# Patient Record
Sex: Male | Born: 1970 | Hispanic: Yes
Health system: Southern US, Community
[De-identification: ages and names within clinical notes are randomized; demographics above are authoritative.]

---

## 2019-03-28 ENCOUNTER — Other Ambulatory Visit: Payer: Self-pay

## 2019-03-28 ENCOUNTER — Encounter: Payer: Self-pay | Admitting: Emergency Medicine

## 2019-03-28 ENCOUNTER — Emergency Department
Admission: EM | Admit: 2019-03-28 | Discharge: 2019-03-28 | Disposition: A | Discharge status: Home / Self Care | Payer: Self-pay | Attending: Emergency Medicine | Admitting: Emergency Medicine

## 2019-03-28 ENCOUNTER — Emergency Department: Payer: Self-pay

## 2019-03-28 DIAGNOSIS — R0789 Other chest pain: Secondary | ICD-10-CM | POA: Insufficient documentation

## 2019-03-28 LAB — CBC
HCT: 41.5 % (ref 39.0–52.0)
Hemoglobin: 14.3 g/dL (ref 13.0–17.0)
MCH: 31.6 pg (ref 26.0–34.0)
MCHC: 34.5 g/dL (ref 30.0–36.0)
MCV: 91.6 fL (ref 80.0–100.0)
Platelets: 198 10*3/uL (ref 150–400)
RBC: 4.53 MIL/uL (ref 4.22–5.81)
RDW: 12.3 % (ref 11.5–15.5)
WBC: 7.3 10*3/uL (ref 4.0–10.5)
nRBC: 0 % (ref 0.0–0.2)

## 2019-03-28 LAB — BASIC METABOLIC PANEL
Anion gap: 7 (ref 5–15)
BUN: 17 mg/dL (ref 6–20)
CO2: 25 mmol/L (ref 22–32)
Calcium: 8.9 mg/dL (ref 8.9–10.3)
Chloride: 108 mmol/L (ref 98–111)
Creatinine, Ser: 0.9 mg/dL (ref 0.61–1.24)
GFR calc Af Amer: >60 mL/min (ref >60)
GFR calc non Af Amer: >60 mL/min (ref >60)
Glucose, Bld: 167 mg/dL — ABNORMAL HIGH (ref 70–99)
Potassium: 3.4 mmol/L — ABNORMAL LOW (ref 3.5–5.1)
Sodium: 140 mmol/L (ref 135–145)

## 2019-03-28 LAB — TROPONIN I (HIGH SENSITIVITY): Troponin I (High Sensitivity): <2 ng/L (ref <18)

## 2019-03-28 MED ORDER — SODIUM CHLORIDE 0.9% FLUSH
3.0000 mL | Freq: Once | INTRAVENOUS | Status: AC
  Administered 2019-03-28: 3 mL via INTRAVENOUS

## 2019-03-28 MED ORDER — NAPROXEN 500 MG PO TABS: 500.0000 mg | ORAL_TABLET | Freq: Two times a day (BID) | ORAL | 2 refills | Status: AC

## 2019-03-28 MED ORDER — KETOROLAC TROMETHAMINE 30 MG/ML IJ SOLN
30.0000 mg | Freq: Once | INTRAMUSCULAR | Status: AC
  Administered 2019-03-28: 30 mg via INTRAMUSCULAR
  Filled 2019-03-28: qty 1

## 2019-03-28 NOTE — ED Provider Notes (Signed)
[facility] Emergency Department Provider Note   ____________________________________________    I have reviewed the triage vital signs and the nursing notes.   HISTORY  Chief Complaint Chest Pain     HPI Ian Rodriguez is a 48 y.o. male who presents with complaints of chest pain.  Patient describes left-sided chest discomforts essentially in his left axilla which is worse with particular movements that is been ongoing over the last 2 weeks.  He denies fevers or chills.  No cough or shortness of breath.  No nausea vomiting or diaphoresis.  He has never had this before.  He denies calf pain or swelling.  No pleurisy.  Has not take anything for this.  Does not smoke.  History reviewed. No pertinent past medical history.  There are no active problems to display for this patient.   History reviewed. No pertinent surgical history.  Prior to Admission medications   Medication Sig Start Date End Date Taking? Authorizing Provider  naproxen (NAPROSYN) 500 MG tablet Take 1 tablet (500 mg total) by mouth 2 (two) times daily with a meal. 03/28/19   Lavonia Drafts, MD     Allergies Patient has no allergy information on record.  No family history on file.  Social History No smoking, no alcohol Review of Systems  Constitutional: No fever/chills Eyes: No visual changes.  ENT: No sore throat. Cardiovascular: As above Respiratory: Denies shortness of breath. Gastrointestinal: No abdominal pain.  No nausea, no vomiting.   Genitourinary: Negative for dysuria. Musculoskeletal: Negative for back pain. Skin: Negative for rash. Neurological: Negative for headaches or weakness   ____________________________________________   PHYSICAL EXAM:  VITAL SIGNS: ED Triage Vitals  Enc Vitals Group     BP 03/28/19 2011 131/71     Pulse Rate 03/28/19 2011 64     Resp 03/28/19 2011 19     Temp 03/28/19 2011 98.6 F (37 C)     Temp Source 03/28/19 2011 Oral   SpO2 03/28/19 2011 98 %     Weight 03/28/19 2013 81.6 kg (180 lb)     Height 03/28/19 2013 1.702 m (5\' 7" )     Head Circumference --      Peak Flow --      Pain Score 03/28/19 2013 6     Pain Loc --      Pain Edu? --      Excl. in [place]? --     Constitutional: Alert and oriented. No acute distress. Pleasant and interactive  Nose: No congestion/rhinnorhea.  Cardiovascular: Normal rate, regular rhythm. Grossly normal heart sounds.  Good peripheral circulation.  Point tenderness to palpation and essentially the left mid axillary line at approximately the fourth rib which causes pain, no rash swelling or abnormality noted Respiratory: Normal respiratory effort.  No retractions. Lungs CTAB. Gastrointestinal: Soft and nontender. No distention.  No CVA tenderness.  Musculoskeletal: No lower extremity tenderness nor edema.  Warm and well perfused Neurologic:  Normal speech and language. No gross focal neurologic deficits are appreciated.  Skin:  Skin is warm, dry and intact. No rash noted. Psychiatric: Mood and affect are normal. Speech and behavior are normal.  ____________________________________________   LABS (all labs ordered are listed, but only abnormal results are displayed)  Labs Reviewed  BASIC METABOLIC PANEL - Abnormal; Notable for the following components:      Result Value   Potassium 3.4 (*)    Glucose, Bld 167 (*)    All other components within normal limits  CBC  TROPONIN I (HIGH SENSITIVITY)   ____________________________________________  EKG  ED ECG REPORT I, Jene Everyobert Anson Peddie, the attending physician, personally viewed and interpreted this ECG.  Date: 03/28/2019  Rhythm: normal sinus rhythm QRS Axis: normal Intervals: normal ST/T Wave abnormalities: normal Narrative Interpretation: no evidence of acute ischemia  ____________________________________________  RADIOLOGY  Chest x-ray unremarkable ____________________________________________   PROCEDURES   Procedure(s) performed: No  Procedures   Critical Care performed: No ____________________________________________   INITIAL IMPRESSION / ASSESSMENT AND PLAN / ED COURSE  Pertinent labs & imaging results that were available during my care of the patient were reviewed by me and considered in my medical decision making (see chart for details).  Patient well-appearing in no acute distress, exam is most consistent with musculoskeletal chest pain, pending labs, chest x-ray.  EKG is quite reassuring  Troponin is normal, chest x-ray is unremarkable, lab work is quite reassuring as well.  Patient treated with IM Toradol suspect musculoskeletal chest pain, will treat with naproxen however will refer to cardiology for further work-up of this pain continues.    ____________________________________________   FINAL CLINICAL IMPRESSION(S) / ED DIAGNOSES  Final diagnoses:  Atypical chest pain        Note:  This document was prepared using Dragon voice recognition software and may include unintentional dictation errors.   Jene EveryKinner, Antoinette Haskett, MD 03/28/19 2228

## 2019-03-28 NOTE — ED Notes (Signed)
Patient discharged to home per MD order. Patient in stable condition, and deemed medically cleared by ED provider for discharge. Discharge instructions reviewed with patient/family using "Teach Back"; verbalized understanding of medication education and administration, and information about follow-up care. Denies further concerns. ° °

## 2019-03-28 NOTE — ED Triage Notes (Signed)
Patient was seen at an urgent care this morning for chest pain going on a week and was told to come to the ER and get blood work. Patient states the pain feels like pressure. Denies shortness of breath. Patient has taken ibuprofen and seemed to help.

## 2019-12-02 IMAGING — CR CHEST - 2 VIEW
2 series · 2 of 2 positions shown · non-contrast
Comparison: None.

CLINICAL DATA: Chest pain

EXAM:
CHEST - 2 VIEW

[chest pa]
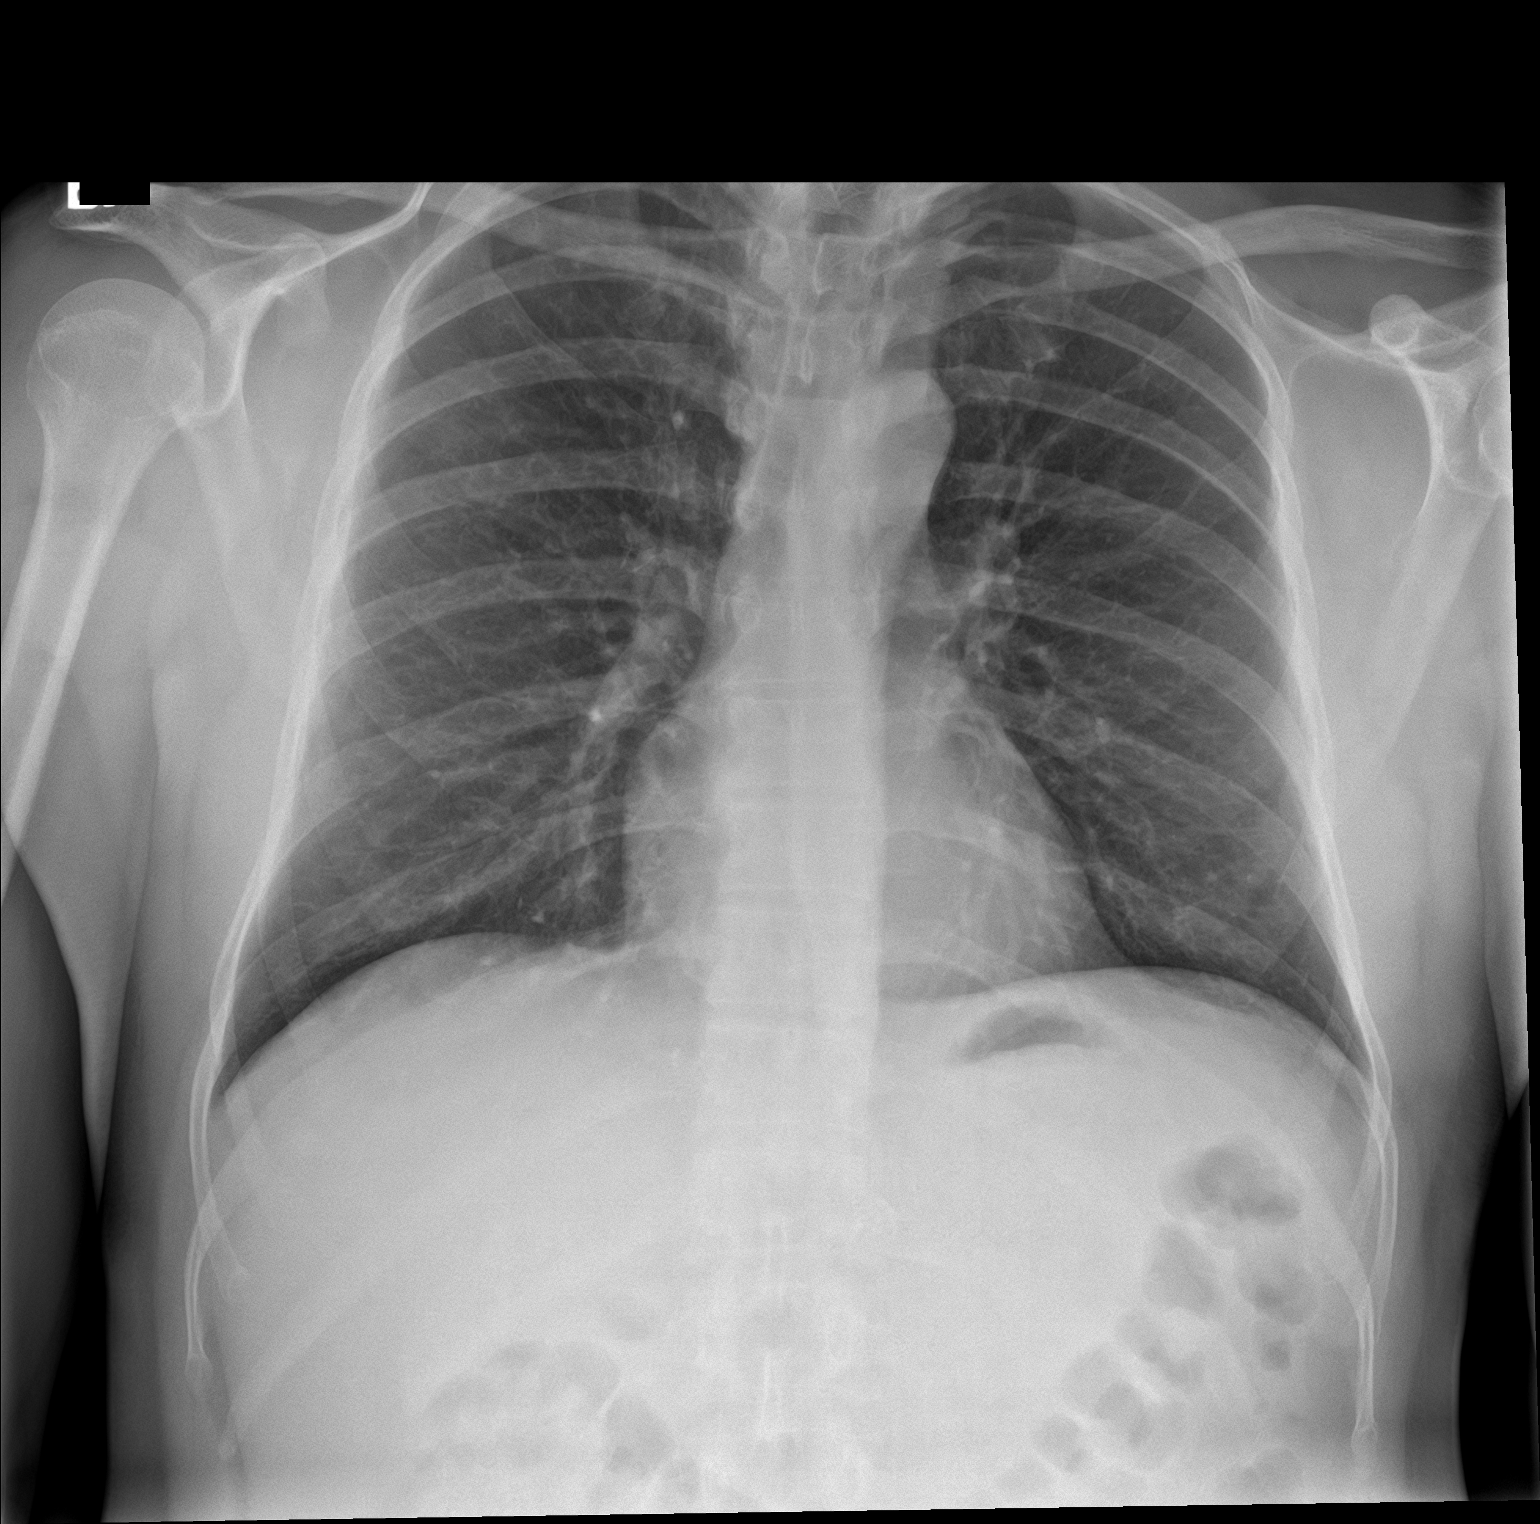

[chest lat]
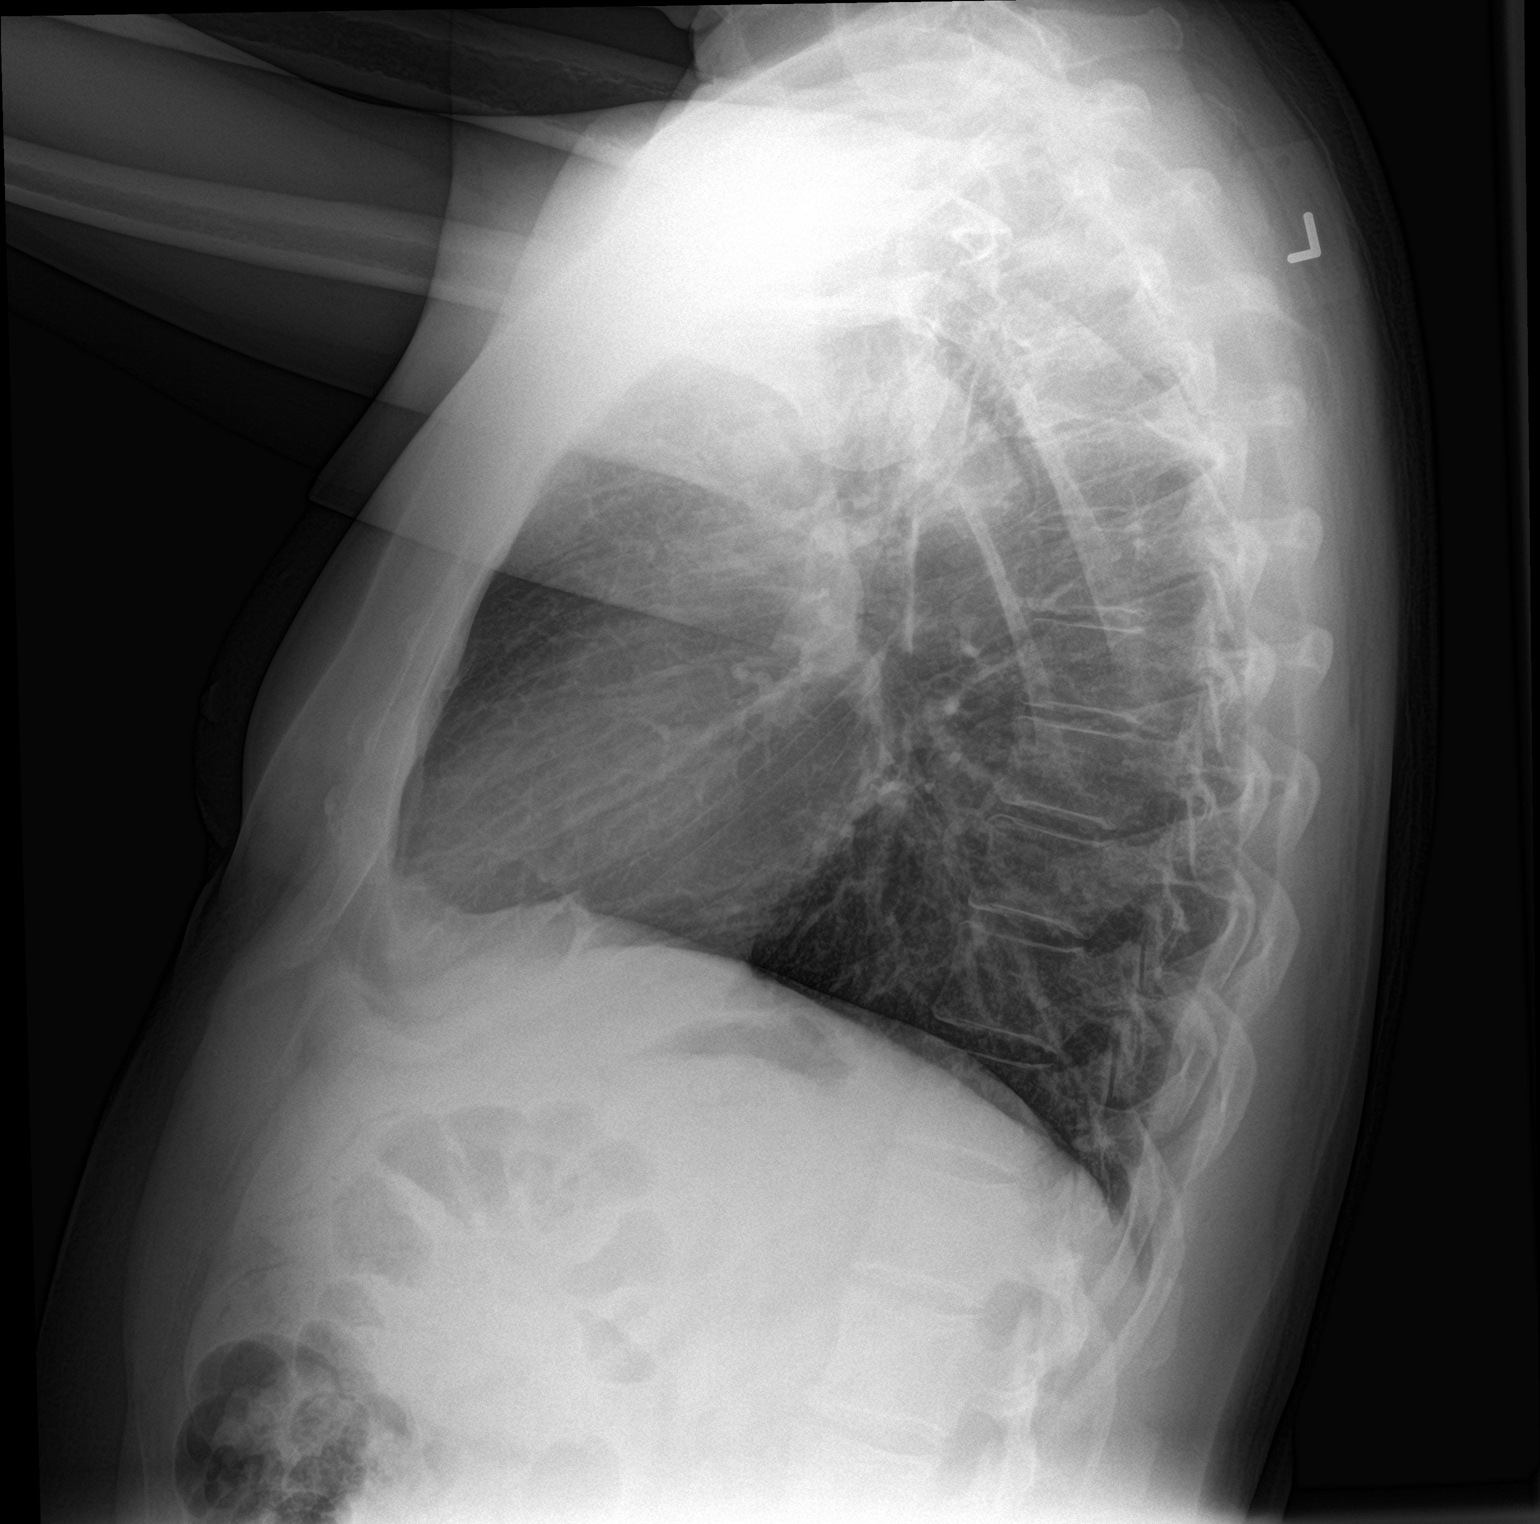

[2 of 2 positions shown; findings below may reference images not displayed]

FINDINGS: The heart size and mediastinal contours are within normal limits.
Both lungs are clear. The visualized skeletal structures are
unremarkable.
IMPRESSION: No active cardiopulmonary disease.
